# Patient Record
Sex: Female | Born: 1950 | Race: White | Hispanic: Yes | Marital: Married | Smoking: Never smoker
Health system: Southern US, Community
[De-identification: ages and names within clinical notes are randomized; demographics above are authoritative.]

## PROBLEM LIST (undated history)

## (undated) DIAGNOSIS — C801 Malignant (primary) neoplasm, unspecified: Secondary | ICD-10-CM

## (undated) DIAGNOSIS — I10 Essential (primary) hypertension: Secondary | ICD-10-CM

## (undated) HISTORY — PX: GALLBLADDER SURGERY: SHX652

## (undated) HISTORY — PX: TONSILECTOMY, ADENOIDECTOMY, BILATERAL MYRINGOTOMY AND TUBES: SHX2538

---

## 2021-09-12 ENCOUNTER — Other Ambulatory Visit: Payer: Self-pay

## 2021-09-12 ENCOUNTER — Ambulatory Visit
Admission: RE | Admit: 2021-09-12 | Discharge: 2021-09-12 | Disposition: A | Discharge status: Home / Self Care | Payer: Medicare (Managed Care) | Source: Ambulatory Visit | Attending: Physician Assistant | Admitting: Physician Assistant

## 2021-09-12 VITALS — BP 137/63 | HR 90 | Temp 98.1°F | Resp 19 | Ht 65.0 in | Wt 205.0 lb

## 2021-09-12 DIAGNOSIS — R0981 Nasal congestion: Secondary | ICD-10-CM | POA: Insufficient documentation

## 2021-09-12 DIAGNOSIS — J069 Acute upper respiratory infection, unspecified: Secondary | ICD-10-CM | POA: Insufficient documentation

## 2021-09-12 DIAGNOSIS — Z20822 Contact with and (suspected) exposure to covid-19: Secondary | ICD-10-CM | POA: Insufficient documentation

## 2021-09-12 DIAGNOSIS — R051 Acute cough: Secondary | ICD-10-CM | POA: Diagnosis present

## 2021-09-12 MED ORDER — PROMETHAZINE-DM 6.25-15 MG/5ML PO SYRP: 5.0000 mL | ORAL_SOLUTION | Freq: Four times a day (QID) | ORAL | 0 refills | Status: DC | PRN

## 2021-09-12 NOTE — ED Provider Notes (Signed)
[facility]    CSN: [id number] Arrival date & time: 09/12/21  1253      History   Chief Complaint Chief Complaint  Patient presents with   Cough    HPI Leah Henry is a 70 y.o. female presenting for 5-day history of cough that is productive of whitish colored sputum.  She also admits to a lot of nasal congestion with thicker nasal drainage that has been previously.  Denies fever.  Admits to some fatigue.  No sinus pain, chest pain or breathing difficulty.  She does state that whenever she coughs she has pain of the left side of her back.  She is concerned for possible pneumonia since she has had pneumonia in the past.  Her mother is ill with similar symptoms.  Patient denies any known COVID exposure.  She has been fully vaccinated for COVID-19.  She has not been taking any over-the-counter cough medication because she says she wanted to be evaluated first.  No history of asthma or breathing issues.  History of hypertension.  No other complaints.  HPI  History reviewed. No pertinent past medical history.  There are no problems to display for this patient.   History reviewed. No pertinent surgical history.  OB History   No obstetric history on file.      Home Medications    Prior to Admission medications   Medication Sig Start Date End Date Taking? Authorizing Provider  carbamazepine (TEGRETOL) 200 MG tablet Take 1 tablet by mouth 3 (three) times daily. 12/15/09  Yes [provider]  metoprolol tartrate (LOPRESSOR) 25 MG tablet TAKE 1 TABLET BY MOUTH WITH FOOD TWICE A DAY FOR 30 DAYS 10/29/16  Yes [provider]  promethazine-dextromethorphan (PROMETHAZINE-DM) 6.25-15 MG/5ML syrup Take 5 mLs by mouth 4 (four) times daily as needed for cough. 09/12/21  Yes Laurene Footman B, PA-C  lisinopril (ZESTRIL) 40 MG tablet Take by mouth.    [provider]    Family History History reviewed. No pertinent family history.  Social  History Social History   Tobacco Use   Smoking status: Never   Smokeless tobacco: Never  Vaping Use   Vaping Use: Never used  Substance Use Topics   Alcohol use: Never     Allergies   Aspirin and Sulfa antibiotics   Review of Systems Review of Systems  Constitutional:  Positive for fatigue. Negative for chills, diaphoresis and fever.  HENT:  Positive for congestion and rhinorrhea. Negative for ear pain, sinus pressure, sinus pain and sore throat.   Respiratory:  Positive for cough. Negative for shortness of breath.   Gastrointestinal:  Negative for abdominal pain, nausea and vomiting.  Musculoskeletal:  Negative for arthralgias and myalgias.  Skin:  Negative for rash.  Neurological:  Negative for weakness and headaches.  Hematological:  Negative for adenopathy.    Physical Exam Triage Vital Signs ED Triage Vitals  Enc Vitals Group     BP 09/12/21 1342 137/63     Pulse Rate 09/12/21 1342 90     Resp 09/12/21 1342 19     Temp 09/12/21 1342 98.1 F (36.7 C)     Temp Source 09/12/21 1342 Oral     SpO2 09/12/21 1342 98 %     Weight 09/12/21 1343 205 lb (93 kg)     Height 09/12/21 1343 5\' 5"  (1.651 m)     Head Circumference --      Peak Flow --      Pain Score 09/12/21 1340  0     Pain Loc --      Pain Edu? --      Excl. in [place]? --    No data found.  Updated Vital Signs BP 137/63 (BP Location: Right Arm)   Pulse 90   Temp 98.1 F (36.7 C) (Oral)   Resp 19   Ht 5\' 5"  (1.651 m)   Wt 205 lb (93 kg)   SpO2 98%   BMI 34.11 kg/m      Physical Exam Vitals and nursing note reviewed.  Constitutional:      General: She is not in acute distress.    Appearance: Normal appearance. She is not ill-appearing or toxic-appearing.  HENT:     Head: Normocephalic and atraumatic.     Nose: Congestion present.     Mouth/Throat:     Mouth: Mucous membranes are moist.     Pharynx: Oropharynx is clear.  Eyes:     General: No scleral icterus.       Right eye: No discharge.         Left eye: No discharge.     Conjunctiva/sclera: Conjunctivae normal.  Cardiovascular:     Rate and Rhythm: Normal rate and regular rhythm.     Heart sounds: Normal heart sounds.  Pulmonary:     Effort: Pulmonary effort is normal. No respiratory distress.     Breath sounds: Normal breath sounds. No wheezing, rhonchi or rales.  Musculoskeletal:     Cervical back: Neck supple.  Skin:    General: Skin is dry.  Neurological:     General: No focal deficit present.     Mental Status: She is alert. Mental status is at baseline.     Motor: No weakness.     Gait: Gait normal.  Psychiatric:        Mood and Affect: Mood normal.        Behavior: Behavior normal.        Thought Content: Thought content normal.     UC Treatments / Results  Labs (all labs ordered are listed, but only abnormal results are displayed) Labs Reviewed  SARS CORONAVIRUS 2 (TAT 6-24 HRS)    EKG   Radiology No results found.  Procedures Procedures (including critical care time)  Medications Ordered in UC Medications - No data to display  Initial Impression / Assessment and Plan / UC Course  I have reviewed the triage vital signs and the nursing notes.  Pertinent labs & imaging results that were available during my care of the patient were reviewed by me and considered in my medical decision making (see chart for details).  70 year old female presenting for cough and congestion for the past 5 days.  Mother is ill with similar symptoms.  No known COVID exposure.  Vitals all normal and stable and she is overall well-appearing.  Exam significant for nasal congestion.  Chest is clear to auscultation heart regular rate and rhythm.  Advised patient of my low concern for pneumonia at this time given her normal vital signs and clear chest.  Advised close monitoring and if she develops a fever, worsening cough, chest pain or breathing difficulty she should be seen again and we can consider chest x-ray at that  time but not warranted currently.  Pain in her back is likely due to all of the coughing.  She has not been taking any cough suppressants.  PCR COVID test obtained.  Current CDC guidelines, isolation protocol and ED precautions reviewed if COVID-positive.  I have sent Promethazine DM for cough and encouraged her to increase rest and fluids.  Follow-up as needed.  Final Clinical Impressions(s) / UC Diagnoses   Final diagnoses:  Viral upper respiratory tract infection  Acute cough  Nasal congestion     Discharge Instructions      URI/COLD SYMPTOMS: Your exam today is consistent with a viral illness. Antibiotics are not indicated at this time. Use medications as directed, including cough syrup, nasal saline, and decongestants. Your symptoms should improve over the next few days and resolve within 7-10 days. Increase rest and fluids. F/u if symptoms worsen or predominate such as sore throat, ear pain, productive cough, shortness of breath, or if you develop high fevers or worsening fatigue over the next several days.    You have received COVID testing today either for positive exposure, concerning symptoms that could be related to COVID infection, screening purposes, or re-testing after confirmed positive.  Your test obtained today checks for active viral infection in the last 1-2 weeks. If your test is negative now, you can still test positive later. So, if you do develop symptoms you should either get re-tested and/or isolate x 5 days and then strict mask use x 5 days (unvaccinated) or mask use x 10 days (vaccinated). Please follow CDC guidelines.  While Rapid antigen tests come back in 15-20 minutes, send out PCR/molecular test results typically come back within 1-3 days. In the mean time, if you are symptomatic, assume this could be a positive test and treat/monitor yourself as if you do have COVID.   We will call with test results if positive. Please download the MyChart app and set up a  profile to access test results.   If symptomatic, go home and rest. Push fluids. Take Tylenol as needed for discomfort. Gargle warm salt water. Throat lozenges. Take Mucinex DM or Robitussin for cough. Humidifier in bedroom to ease coughing. Warm showers. Also review the COVID handout for more information.  COVID-19 INFECTION: The incubation period of COVID-19 is approximately 14 days after exposure, with most symptoms developing in roughly 4-5 days. Symptoms may range in severity from mild to critically severe. Roughly 80% of those infected will have mild symptoms. People of any age may become infected with COVID-19 and have the ability to transmit the virus. The most common symptoms include: fever, fatigue, cough, body aches, headaches, sore throat, nasal congestion, shortness of breath, nausea, vomiting, diarrhea, changes in smell and/or taste.    COURSE OF ILLNESS Some patients may begin with mild disease which can progress quickly into critical symptoms. If your symptoms are worsening please call ahead to the Emergency Department and proceed there for further treatment. Recovery time appears to be roughly 1-2 weeks for mild symptoms and 3-6 weeks for severe disease.   GO IMMEDIATELY TO ER FOR FEVER YOU ARE UNABLE TO GET DOWN WITH TYLENOL, BREATHING PROBLEMS, CHEST PAIN, FATIGUE, LETHARGY, INABILITY TO EAT OR DRINK, ETC  QUARANTINE AND ISOLATION: To help decrease the spread of COVID-19 please remain isolated if you have COVID infection or are highly suspected to have COVID infection. This means -stay home and isolate to one room in the home if you live with others. Do not share a bed or bathroom with others while ill, sanitize and wipe down all countertops and keep common areas clean and disinfected. Stay home for 5 days. If you have no symptoms or your symptoms are resolving after 5 days, you can leave your house. Continue to wear a  mask around others for 5 additional days. If you have been in close  contact (within 6 feet) of someone diagnosed with COVID 19, you are advised to quarantine in your home for 14 days as symptoms can develop anywhere from 2-14 days after exposure to the virus. If you develop symptoms, you  must isolate.  Most current guidelines for COVID after exposure -unvaccinated: isolate 5 days and strict mask use x 5 days. Test on day 5 is possible -vaccinated: wear mask x 10 days if symptoms do not develop -You do not necessarily need to be tested for COVID if you have + exposure and  develop symptoms. Just isolate at home x10 days from symptom onset During this global pandemic, CDC advises to practice social distancing, try to stay at least 16ft away from others at all times. Wear a face covering. Wash and sanitize your hands regularly and avoid going anywhere that is not necessary.  KEEP IN MIND THAT THE COVID TEST IS NOT 100% ACCURATE AND YOU SHOULD STILL DO EVERYTHING TO PREVENT POTENTIAL SPREAD OF VIRUS TO OTHERS (WEAR MASK, WEAR GLOVES, [place] HANDS AND SANITIZE REGULARLY). IF INITIAL TEST IS NEGATIVE, THIS MAY NOT MEAN YOU ARE DEFINITELY NEGATIVE. MOST ACCURATE TESTING IS DONE 5-7 DAYS AFTER EXPOSURE.   It is not advised by CDC to get re-tested after receiving a positive COVID test since you can still test positive for weeks to months after you have already cleared the virus.   *If you have not been vaccinated for COVID, I strongly suggest you consider getting vaccinated as long as there are no contraindications.       ED Prescriptions     Medication Sig Dispense Auth. Provider   promethazine-dextromethorphan (PROMETHAZINE-DM) 6.25-15 MG/5ML syrup Take 5 mLs by mouth 4 (four) times daily as needed for cough. 118 mL Danton Clap, PA-C      PDMP not reviewed this encounter.   Danton Clap, PA-C 09/12/21 1516

## 2021-09-12 NOTE — Discharge Instructions (Signed)

## 2021-09-12 NOTE — ED Triage Notes (Addendum)
Pt reports 5 days of cough and productive of light colored sputum. Hurts in her back when coughing. Pt states she has had pneumonia 4 times before

## 2021-09-13 ENCOUNTER — Ambulatory Visit
Admission: EM | Admit: 2021-09-13 | Discharge: 2021-09-13 | Disposition: A | Discharge status: Home / Self Care | Payer: Medicare (Managed Care) | Attending: Medical Oncology | Admitting: Medical Oncology

## 2021-09-13 ENCOUNTER — Other Ambulatory Visit: Payer: Self-pay

## 2021-09-13 ENCOUNTER — Ambulatory Visit (INDEPENDENT_AMBULATORY_CARE_PROVIDER_SITE_OTHER): Payer: Medicare (Managed Care)

## 2021-09-13 DIAGNOSIS — R051 Acute cough: Secondary | ICD-10-CM | POA: Diagnosis not present

## 2021-09-13 DIAGNOSIS — Z8701 Personal history of pneumonia (recurrent): Secondary | ICD-10-CM | POA: Insufficient documentation

## 2021-09-13 DIAGNOSIS — R059 Cough, unspecified: Secondary | ICD-10-CM

## 2021-09-13 DIAGNOSIS — Z8709 Personal history of other diseases of the respiratory system: Secondary | ICD-10-CM | POA: Insufficient documentation

## 2021-09-13 DIAGNOSIS — R509 Fever, unspecified: Secondary | ICD-10-CM

## 2021-09-13 HISTORY — DX: Malignant (primary) neoplasm, unspecified: C80.1

## 2021-09-13 LAB — INFLUENZA A AND B ANTIGEN (CONVERTED LAB)
INFLUENZA A ANTIGEN, POC: NEGATIVE
INFLUENZA B ANTIGEN, POC: NEGATIVE

## 2021-09-13 LAB — SARS CORONAVIRUS 2 (TAT 6-24 HRS): SARS Coronavirus 2: NEGATIVE

## 2021-09-13 MED ORDER — METHYLPREDNISOLONE SODIUM SUCC 125 MG IJ SOLR
125.0000 mg | Freq: Once | INTRAMUSCULAR | Status: AC
  Administered 2021-09-13: 125 mg via INTRAMUSCULAR

## 2021-09-13 MED ORDER — ALBUTEROL SULFATE HFA 108 (90 BASE) MCG/ACT IN AERS: 1.0000 | INHALATION_SPRAY | Freq: Four times a day (QID) | RESPIRATORY_TRACT | 0 refills | Status: DC | PRN

## 2021-09-13 MED ORDER — BENZONATATE 100 MG PO CAPS: 100.0000 mg | ORAL_CAPSULE | Freq: Three times a day (TID) | ORAL | 0 refills | Status: DC

## 2021-09-13 MED ORDER — FLUTICASONE PROPIONATE 50 MCG/ACT NA SUSP: 2.0000 | Freq: Every day | NASAL | 0 refills | Status: DC

## 2021-09-13 NOTE — ED Provider Notes (Addendum)
[facility]    CSN: [id number] Arrival date & time: 09/13/21  1055      History   Chief Complaint Chief Complaint  Patient presents with   Cough    HPI Leah Henry is a 70 y.o. female.   HPI  Cough: From her visit yesterday: "Leah Henry is a 70 y.o. female presenting for 5-day history of cough that is productive of whitish colored sputum.  She also admits to a lot of nasal congestion with thicker nasal drainage that has been previously.  Denies fever.  Admits to some fatigue.  No sinus pain, chest pain or breathing difficulty.  She does state that whenever she coughs she has pain of the left side of her back.  She is concerned for possible pneumonia since she has had pneumonia in the past.  Her mother is ill with similar symptoms.  Patient denies any known COVID exposure.  She has been fully vaccinated for COVID-19.  She has not been taking any over-the-counter cough medication because she says she wanted to be evaluated first.  No history of asthma or breathing issues.  History of hypertension.  No other complaints."   Today she states that cough has worsened in terms of how frequently she is coughing. She is also now blowing out "large amounts" of yellow nasal discharge from her nose. She is concerned that given these changes she has pneumonia. Of note, she has not had any fevers, SOB, chest pain. Her COVID-19 test yesterday was negative.   Past Medical History:  Diagnosis Date   Cancer ([place])     There are no problems to display for this patient.   History reviewed. No pertinent surgical history.  OB History   No obstetric history on file.      Home Medications    Prior to Admission medications   Medication Sig Start Date End Date Taking? Authorizing Provider  carbamazepine (TEGRETOL) 200 MG tablet Take 1 tablet by mouth 3 (three) times daily. 12/15/09   [provider]  lisinopril (ZESTRIL) 40 MG tablet Take by mouth.    [provider]  metoprolol tartrate (LOPRESSOR) 25 MG tablet TAKE 1 TABLET BY MOUTH WITH FOOD TWICE A DAY FOR 30 DAYS 10/29/16   [provider]  promethazine-dextromethorphan (PROMETHAZINE-DM) 6.25-15 MG/5ML syrup Take 5 mLs by mouth 4 (four) times daily as needed for cough. 09/12/21   Danton Clap, PA-C    Family History History reviewed. No pertinent family history.  Social History Social History   Tobacco Use   Smoking status: Never   Smokeless tobacco: Never  Vaping Use   Vaping Use: Never used  Substance Use Topics   Alcohol use: Never     Allergies   Aspirin and Sulfa antibiotics   Review of Systems Review of Systems  As stated above in HPI Physical Exam Triage Vital Signs ED Triage Vitals  Enc Vitals Group     BP 09/13/21 1109 130/78     Pulse Rate 09/13/21 1109 93     Resp 09/13/21 1109 20     Temp 09/13/21 1109 98.2 F (36.8 C)     Temp Source 09/13/21 1109 Oral     SpO2 09/13/21 1109 98 %     Weight 09/13/21 1108 204 lb 12.9 oz (92.9 kg)     Height 09/13/21 1108 5\' 5"  (1.651 m)     Head Circumference --      Peak Flow --      Pain  Score 09/13/21 1108 0     Pain Loc --      Pain Edu? --      Excl. in [place]? --    No data found.  Updated Vital Signs BP 130/78 (BP Location: Right Arm)   Pulse 93   Temp 98.2 F (36.8 C) (Oral)   Resp 20   Ht 5\' 5"  (1.651 m)   Wt 204 lb 12.9 oz (92.9 kg)   SpO2 98%   BMI 34.08 kg/m   Physical Exam Vitals and nursing note reviewed.  Constitutional:      General: She is not in acute distress.    Appearance: Normal appearance. She is not ill-appearing, toxic-appearing or diaphoretic.     Comments: Wheeze like cough  HENT:     Head: Normocephalic and atraumatic.     Right Ear: Tympanic membrane normal.     Left Ear: Tympanic membrane normal.     Nose: Congestion and rhinorrhea (mild clear/yellow) present.  Eyes:     Extraocular Movements: Extraocular movements intact.     Conjunctiva/sclera:  Conjunctivae normal.     Pupils: Pupils are equal, round, and reactive to light.  Cardiovascular:     Rate and Rhythm: Normal rate and regular rhythm.     Heart sounds: Normal heart sounds.  Pulmonary:     Effort: Pulmonary effort is normal. No respiratory distress.     Breath sounds: Wheezing (scant throughout) present. No rhonchi or rales.  Musculoskeletal:     Cervical back: Normal range of motion and neck supple.  Lymphadenopathy:     Cervical: No cervical adenopathy.  Skin:    General: Skin is warm.  Neurological:     Mental Status: She is alert and oriented to person, place, and time.     UC Treatments / Results  Labs (all labs ordered are listed, but only abnormal results are displayed) Labs Reviewed - No data to display  EKG   Radiology DG Chest 2 View  Result Date: 09/13/2021 CLINICAL DATA:  Cough and fever EXAM: CHEST - 2 VIEW COMPARISON:  None. FINDINGS: Normal heart size and mediastinal contours. No acute infiltrate or edema. No effusion or pneumothorax. No acute osseous findings. Degenerative endplate spurring IMPRESSION: Negative chest Electronically Signed   By: Jorje Guild M.D.   On: 09/13/2021 11:23    Procedures Procedures (including critical care time)  Medications Ordered in UC Medications - No data to display  Initial Impression / Assessment and Plan / UC Course  I have reviewed the triage vital signs and the nursing notes.  Pertinent labs & imaging results that were available during my care of the patient were reviewed by me and considered in my medical decision making (see chart for details).     New.  Comparing the examinations from yesterday and today she does have some wheezing.  Given her history and this fact we are going to obtain a chest x-ray to ensure no sign of pneumonia as she reports that she has had pneumonia 4 times.  In addition I am going to administer Solu-Medrol(she reports that she has tolerated well in the past) and have her  start Tessalon and Flonase to help with her symptoms and ensure an influenza test is negative.  Discussed red flag signs and symptoms.  Follow-up. Final Clinical Impressions(s) / UC Diagnoses   Final diagnoses:  None   Discharge Instructions   None    ED Prescriptions   None    PDMP not reviewed this  encounter.   Hughie Closs, PA-C 09/13/21 [place], PA-C 09/13/21 1206

## 2021-09-13 NOTE — ED Triage Notes (Signed)
Pt seen here yesterday for cough. Cough continues to worsen. Blowing large amounts of pale yellow secretions from nose

## 2021-09-14 LAB — POC SARS CORONAVIRUS 2 AG: SARSCOV2ONAVIRUS 2 AG: NEGATIVE

## 2021-09-18 ENCOUNTER — Other Ambulatory Visit: Payer: Self-pay

## 2021-09-18 ENCOUNTER — Encounter: Payer: Self-pay | Admitting: Emergency Medicine

## 2021-09-18 ENCOUNTER — Ambulatory Visit
Admission: EM | Admit: 2021-09-18 | Discharge: 2021-09-18 | Disposition: A | Discharge status: Home / Self Care | Payer: Medicare (Managed Care) | Attending: Medical Oncology | Admitting: Medical Oncology

## 2021-09-18 DIAGNOSIS — J209 Acute bronchitis, unspecified: Secondary | ICD-10-CM | POA: Diagnosis not present

## 2021-09-18 DIAGNOSIS — Z8701 Personal history of pneumonia (recurrent): Secondary | ICD-10-CM

## 2021-09-18 MED ORDER — PREDNISONE 10 MG PO TABS: ORAL_TABLET | ORAL | 0 refills | Status: DC

## 2021-09-18 MED ORDER — DOXYCYCLINE HYCLATE 100 MG PO CAPS: 100.0000 mg | ORAL_CAPSULE | Freq: Two times a day (BID) | ORAL | 0 refills | Status: DC

## 2021-09-18 NOTE — ED Provider Notes (Addendum)
[facility]    CSN: [id number] Arrival date & time: 09/18/21  1018      History   Chief Complaint Chief Complaint  Patient presents with   Cough   Emesis    HPI Leah Henry is a 70 y.o. female.   HPI  Cough: Patient has had a cough for about 1 week.  She has been seen in office on 1015 and 1016 for symptoms of cough and chest congestion.  She has had a negative chest x-ray, as well as negative COVID and influenza testing was suspected to have a viral illness.  She has been treated with Tessalon, Flonase and Rx cough syrup.  We also administered solu-medrol at her last visit which did help her greatly for 3 days per pt. She states that she has tolerated this and prednisone well in the past. Today she states that the cough has continued and now she is having vomiting episodes from the cough along with thick colored mucous. She denies SOB, chest pain, recent fevers. No hemoptysis, fever but patient reports that she is very concerned about pneumonia given her history of "walking pneumonia".   Past Medical History:  Diagnosis Date   Cancer ([place])     There are no problems to display for this patient.   History reviewed. No pertinent surgical history.  OB History   No obstetric history on file.      Home Medications    Prior to Admission medications   Medication Sig Start Date End Date Taking? Authorizing Provider  albuterol (VENTOLIN HFA) 108 (90 Base) MCG/ACT inhaler Inhale 1-2 puffs into the lungs every 6 (six) hours as needed for wheezing or shortness of breath. 09/13/21   Hughie Closs, PA-C  benzonatate (TESSALON) 100 MG capsule Take 1 capsule (100 mg total) by mouth every 8 (eight) hours. 09/13/21   Hughie Closs, PA-C  carbamazepine (TEGRETOL) 200 MG tablet Take 1 tablet by mouth 3 (three) times daily. 12/15/09   [provider]  fluticasone (FLONASE) 50 MCG/ACT nasal spray Place 2 sprays into both nostrils daily. 09/13/21    Hughie Closs, PA-C  lisinopril (ZESTRIL) 40 MG tablet Take by mouth.    [provider]  metoprolol tartrate (LOPRESSOR) 25 MG tablet TAKE 1 TABLET BY MOUTH WITH FOOD TWICE A DAY FOR 30 DAYS 10/29/16   [provider]  promethazine-dextromethorphan (PROMETHAZINE-DM) 6.25-15 MG/5ML syrup Take 5 mLs by mouth 4 (four) times daily as needed for cough. 09/12/21   Danton Clap, PA-C    Family History History reviewed. No pertinent family history.  Social History Social History   Tobacco Use   Smoking status: Never   Smokeless tobacco: Never  Vaping Use   Vaping Use: Never used  Substance Use Topics   Alcohol use: Never     Allergies   Aspirin and Sulfa antibiotics   Review of Systems Review of Systems  As stated above in HPI Physical Exam Triage Vital Signs ED Triage Vitals  Enc Vitals Group     BP 09/18/21 1033 140/78     Pulse Rate 09/18/21 1033 78     Resp 09/18/21 1033 14     Temp 09/18/21 1033 98.1 F (36.7 C)     Temp Source 09/18/21 1033 Oral     SpO2 09/18/21 1033 98 %     Weight 09/18/21 1031 204 lb 12.9 oz (92.9 kg)     Height 09/18/21 1031 5\' 5"  (1.651 m)  Head Circumference --      Peak Flow --      Pain Score 09/18/21 1031 0     Pain Loc --      Pain Edu? --      Excl. in [place]? --    No data found.  Updated Vital Signs BP 140/78 (BP Location: Right Arm)   Pulse 78   Temp 98.1 F (36.7 C) (Oral)   Resp 14   Ht 5\' 5"  (1.651 m)   Wt 204 lb 12.9 oz (92.9 kg)   SpO2 98%   BMI 34.08 kg/m   Physical Exam Vitals and nursing note reviewed.  Constitutional:      General: She is not in acute distress.    Appearance: Normal appearance. She is not ill-appearing, toxic-appearing or diaphoretic.  HENT:     Head: Normocephalic and atraumatic.     Right Ear: Tympanic membrane normal.     Left Ear: Tympanic membrane normal.     Nose: Congestion and rhinorrhea present.     Mouth/Throat:     Mouth: Mucous membranes are moist.      Pharynx: Oropharynx is clear. No oropharyngeal exudate or posterior oropharyngeal erythema.  Eyes:     Extraocular Movements: Extraocular movements intact.     Conjunctiva/sclera: Conjunctivae normal.     Pupils: Pupils are equal, round, and reactive to light.  Cardiovascular:     Rate and Rhythm: Normal rate and regular rhythm.     Heart sounds: Normal heart sounds.  Pulmonary:     Breath sounds: Wheezing (throughout) present.  Musculoskeletal:     Cervical back: Normal range of motion and neck supple.  Lymphadenopathy:     Cervical: No cervical adenopathy.  Skin:    General: Skin is warm.     Comments: No cyanosis  Neurological:     Mental Status: She is alert and oriented to person, place, and time.     UC Treatments / Results  Labs (all labs ordered are listed, but only abnormal results are displayed) Labs Reviewed - No data to display  EKG   Radiology No results found.  Procedures Procedures (including critical care time)  Medications Ordered in UC Medications - No data to display  Initial Impression / Assessment and Plan / UC Course  I have reviewed the triage vital signs and the nursing notes.  Pertinent labs & imaging results that were available during my care of the patient were reviewed by me and considered in my medical decision making (see chart for details).     New. We have elected to start her on low dose prednisone to avoid medication interaction along with doxycyline. Discussed how to use along with common potential side effects and precautions. Discussed red flag signs and symptoms. Follow up PRN.  Final Clinical Impressions(s) / UC Diagnoses   Final diagnoses:  None   Discharge Instructions   None    ED Prescriptions   None    PDMP not reviewed this encounter.   Hughie Closs, PA-C 09/18/21 1056    [address] [place] 09/18/21 1101

## 2021-09-18 NOTE — ED Triage Notes (Signed)
Patient states that she was seen here on 10/15/ 10/16 for cough and chest congestion.  Patient reports cough for a week.  Patient denies fevers.  Patient reports vomiting due to the congestion in her chest.

## 2022-05-03 ENCOUNTER — Ambulatory Visit
Admission: EM | Admit: 2022-05-03 | Discharge: 2022-05-03 | Disposition: A | Discharge status: Home / Self Care | Payer: Medicare (Managed Care) | Attending: Physician Assistant | Admitting: Physician Assistant

## 2022-05-03 DIAGNOSIS — Z8719 Personal history of other diseases of the digestive system: Secondary | ICD-10-CM | POA: Insufficient documentation

## 2022-05-03 DIAGNOSIS — R300 Dysuria: Secondary | ICD-10-CM | POA: Diagnosis not present

## 2022-05-03 DIAGNOSIS — J029 Acute pharyngitis, unspecified: Secondary | ICD-10-CM | POA: Insufficient documentation

## 2022-05-03 LAB — URINALYSIS, ROUTINE W REFLEX MICROSCOPIC
Bilirubin Urine: NEGATIVE
Glucose, UA: NEGATIVE mg/dL
Hgb urine dipstick: NEGATIVE
Ketones, ur: NEGATIVE mg/dL
Leukocytes,Ua: NEGATIVE
Nitrite: NEGATIVE
Protein, ur: NEGATIVE mg/dL
Specific Gravity, Urine: 1.015 (ref 1.005–1.030)
pH: 5.5 (ref 5.0–8.0)

## 2022-05-03 LAB — WET PREP, GENITAL
Clue Cells Wet Prep HPF POC: NONE SEEN
Sperm: NONE SEEN
Trich, Wet Prep: NONE SEEN
WBC, Wet Prep HPF POC: <10 — AB (ref <10)
Yeast Wet Prep HPF POC: NONE SEEN

## 2022-05-03 LAB — GROUP A STREP BY PCR: Group A Strep by PCR: NOT DETECTED

## 2022-05-03 NOTE — ED Provider Notes (Signed)
Is [facility]    CSN: [id number] Arrival date & time: 05/03/22  1045      History   Chief Complaint Chief Complaint  Patient presents with   Sore Throat   Urinary Tract Infection    HPI Leah Henry is a 71 y.o. female presenting for multiple complaints.  First patient states she has had scratchy and burning throat for the past 4 days.  She denies any associated fever, cough, congestion, sinus pain or pressure.  History of GERD.  Says she does not take any medication for that.  Reports exposure to a sick contacts who also had a sore throat and says she would like a strep test.  Patient also presenting for dysuria for the past 1 week.  Denies associated urinary frequency, urgency, hematuria, abdominal/pelvic pain.  Reports concern for UTI.  Reports she has an upcoming appointment for evaluation for this but wanted to be seen sooner as it is in several days.  Has history of atrophic vaginitis.  Medical history significant for remission of non-Hodgkin's lymphoma as of March 2023.  Other medical history significant for epilepsy and stable/treated mood disorder.  HPI  Past Medical History:  Diagnosis Date   Cancer ([place])     There are no problems to display for this patient.   History reviewed. No pertinent surgical history.  OB History   No obstetric history on file.      Home Medications    Prior to Admission medications   Medication Sig Start Date End Date Taking? Authorizing Provider  carbamazepine (TEGRETOL) 200 MG tablet Take 1 tablet by mouth 3 (three) times daily. 12/15/09  Yes [provider]  lisinopril (ZESTRIL) 40 MG tablet Take by mouth.   Yes [provider]  metoprolol tartrate (LOPRESSOR) 25 MG tablet TAKE 1 TABLET BY MOUTH WITH FOOD TWICE A DAY FOR 30 DAYS 10/29/16  Yes [provider]    Family History History reviewed. No pertinent family history.  Social History Social History   Tobacco Use   Smoking status:  Never   Smokeless tobacco: Never  Vaping Use   Vaping Use: Never used  Substance Use Topics   Alcohol use: Never   Drug use: Never     Allergies   Aspirin and Sulfa antibiotics   Review of Systems Review of Systems  Constitutional:  Negative for chills, diaphoresis, fatigue and fever.  HENT:  Positive for sore throat. Negative for congestion, ear pain and rhinorrhea.   Respiratory:  Negative for cough and shortness of breath.   Gastrointestinal:  Negative for abdominal pain, nausea and vomiting.  Genitourinary:  Positive for dysuria. Negative for difficulty urinating, frequency, hematuria and urgency.  Musculoskeletal:  Negative for myalgias.  Skin:  Negative for rash.  Neurological:  Negative for weakness and headaches.    Physical Exam Triage Vital Signs ED Triage Vitals  Enc Vitals Group     BP 05/03/22 1111 140/79     Pulse Rate 05/03/22 1111 78     Resp 05/03/22 1111 12     Temp 05/03/22 1111 98.6 F (37 C)     Temp Source 05/03/22 1111 Oral     SpO2 05/03/22 1111 100 %     Weight 05/03/22 1110 203 lb (92.1 kg)     Height 05/03/22 1110 '5\' 5"'$  (1.651 m)     Head Circumference --      Peak Flow --      Pain Score 05/03/22 1109 0  Pain Loc --      Pain Edu? --      Excl. in [place]? --    No data found.  Updated Vital Signs BP 140/79 (BP Location: Left Arm)   Pulse 78   Temp 98.6 F (37 C) (Oral)   Resp 12   Ht '5\' 5"'$  (1.651 m)   Wt 203 lb (92.1 kg)   SpO2 100%   BMI 33.78 kg/m          Physical Exam Vitals and nursing note reviewed.  Constitutional:      General: She is not in acute distress.    Appearance: Normal appearance. She is not ill-appearing or toxic-appearing.  HENT:     Head: Normocephalic and atraumatic.     Nose: Nose normal.     Mouth/Throat:     Mouth: Mucous membranes are moist.     Pharynx: Oropharynx is clear. Posterior oropharyngeal erythema (mild) present.  Eyes:     General: No scleral icterus.       Right eye: No  discharge.        Left eye: No discharge.     Conjunctiva/sclera: Conjunctivae normal.  Cardiovascular:     Rate and Rhythm: Normal rate and regular rhythm.     Heart sounds: Normal heart sounds.  Pulmonary:     Effort: Pulmonary effort is normal. No respiratory distress.     Breath sounds: Normal breath sounds.  Genitourinary:    Vagina: No vaginal discharge.     Comments: Slight vulvar dryness Musculoskeletal:     Cervical back: Neck supple.  Skin:    General: Skin is dry.  Neurological:     General: No focal deficit present.     Mental Status: She is alert. Mental status is at baseline.     Motor: No weakness.     Gait: Gait normal.  Psychiatric:        Mood and Affect: Mood normal.        Behavior: Behavior normal.        Thought Content: Thought content normal.     UC Treatments / Results  Labs (all labs ordered are listed, but only abnormal results are displayed) Labs Reviewed  WET PREP, GENITAL - Abnormal; Notable for the following components:      Result Value   WBC, Wet Prep HPF POC <10 (*)    All other components within normal limits  GROUP A STREP BY PCR  URINE CULTURE  URINALYSIS, ROUTINE W REFLEX MICROSCOPIC    EKG   Radiology No results found.  Procedures Procedures (including critical care time)  Medications Ordered in UC Medications - No data to display  Initial Impression / Assessment and Plan / UC Course  I have reviewed the triage vital signs and the nursing notes.  Pertinent labs & imaging results that were available during my care of the patient were reviewed by me and considered in my medical decision making (see chart for details).  71 year old female presenting for multiple complaints including scratchy throat with burning for the past 4 days and no other associated URI symptoms as well as approximately 1 week history of urinary burning.  Vitals normal and stable patient overall well-appearing.  On exam she has mild erythema posterior  pharynx.  Chest clear to auscultation heart regular rate and rhythm.  No abdominal tenderness.  GU exam significant for slight vulvar dryness, otherwise normal.  PCR strep test negative.  Urinalysis normal.  Will send for culture.  Wet  prep negative.  Discussed all results with patient.  Advised her that her sore throat may likely be related to her acid reflux and I offered acid reflux medication but she declines.  Will take OTC Tums if needed.  Patient has appointment with her doctor in 1 week for her vaginal symptoms and burning.  Advised her to keep the appointment.  She may need treatment for atrophic vaginitis.  States she is not sure she wants treatment for that.  History of cancer in remission and she does not want to take any medication if not needed.  Advised to keep the appointment as she may need further work-up for her symptoms.  We will call her if the culture is positive and send antibiotics though.   Final Clinical Impressions(s) / UC Diagnoses   Final diagnoses:  Sore throat  Dysuria  History of gastroesophageal reflux (GERD)     Discharge Instructions      -Your strep is negative.  Your sore throat may be related to acid reflux.  I would suggest over-the-counter Prilosec, plenty of rest and fluids.  Avoid spicy or greasy foods, caffeine or alcohol.  Eat smaller meals and not close to bedtime. - Your urinalysis is normal and the vaginal swab was also normal.  No evidence of UTI or yeast infection or bacterial vaginal infection.  I will send the urine sample to the lab to look further for bacteria but does not appear to be consistent with a UTI at this time.  We will call you if your result is positive and send antibiotics for you.  Increase your fluid intake at this time.  The burning could be related to atrophic vaginitis.  Make a follow-up appointment with your PCP or OB/GYN.  There are different creams that can be prescribed to help with this condition.     ED  Prescriptions   None    PDMP not reviewed this encounter.   Danton Clap, PA-C 05/03/22 1226

## 2022-05-03 NOTE — ED Triage Notes (Signed)
Pt states that she is in cancer remission.   Pt states that she is having "scratching and burning" in her throat x4days and urinary burning x1weeks.   Pt was around her niece who was sick. Pt states that she did not go to the doctor to be evaluated.   Pt asks to be tested for strep throat

## 2022-05-03 NOTE — Discharge Instructions (Signed)
-  Your strep is negative.  Your sore throat may be related to acid reflux.  I would suggest over-the-counter Prilosec, plenty of rest and fluids.  Avoid spicy or greasy foods, caffeine or alcohol.  Eat smaller meals and not close to bedtime. - Your urinalysis is normal and the vaginal swab was also normal.  No evidence of UTI or yeast infection or bacterial vaginal infection.  I will send the urine sample to the lab to look further for bacteria but does not appear to be consistent with a UTI at this time.  We will call you if your result is positive and send antibiotics for you.  Increase your fluid intake at this time.  The burning could be related to atrophic vaginitis.  Make a follow-up appointment with your PCP or OB/GYN.  There are different creams that can be prescribed to help with this condition.

## 2022-05-05 LAB — URINE CULTURE: Culture: NO GROWTH

## 2022-05-06 ENCOUNTER — Encounter: Payer: Self-pay | Admitting: Emergency Medicine

## 2022-05-06 ENCOUNTER — Ambulatory Visit
Admission: EM | Admit: 2022-05-06 | Discharge: 2022-05-06 | Disposition: A | Discharge status: Home / Self Care | Payer: Medicare (Managed Care) | Attending: Emergency Medicine | Admitting: Emergency Medicine

## 2022-05-06 ENCOUNTER — Other Ambulatory Visit: Payer: Self-pay

## 2022-05-06 DIAGNOSIS — J069 Acute upper respiratory infection, unspecified: Secondary | ICD-10-CM

## 2022-05-06 HISTORY — DX: Essential (primary) hypertension: I10

## 2022-05-06 MED ORDER — IPRATROPIUM BROMIDE 0.06 % NA SOLN: 2.0000 | Freq: Four times a day (QID) | NASAL | 12 refills | Status: AC

## 2022-05-06 MED ORDER — BENZONATATE 100 MG PO CAPS: 200.0000 mg | ORAL_CAPSULE | Freq: Three times a day (TID) | ORAL | 0 refills | Status: DC

## 2022-05-06 MED ORDER — PROMETHAZINE-DM 6.25-15 MG/5ML PO SYRP: 5.0000 mL | ORAL_SOLUTION | Freq: Four times a day (QID) | ORAL | 0 refills | Status: DC | PRN

## 2022-05-06 NOTE — ED Triage Notes (Signed)
Onset 2 weeks ago, intermittent symptoms.  Seen 05/03/2022 for symptoms.  Reports throat has not improved .  Patient is concerned congestion may go to chest.  Reports cough keeps her awake at night.  Took robitussin maximum with little help

## 2022-05-06 NOTE — ED Provider Notes (Signed)
[facility]    CSN: [id number] Arrival date & time: 05/06/22  1335      History   Chief Complaint Chief Complaint  Patient presents with   Cough    HPI Leah Henry is a 71 y.o. female.   HPI  71 year old female here for reevaluation of respiratory complaints.  Patient was evaluated 3 days ago in this urgent care for sore throat and nasal congestion.  She continues to have a sore throat and now she has developed a nonproductive cough and some hoarseness.  She states that she is still experiencing nasal congestion with a white nasal discharge.  She denies fever, shortness of breath, or wheezing.  Past Medical History:  Diagnosis Date   Cancer ([place])    Hypertension     There are no problems to display for this patient.   Past Surgical History:  Procedure Laterality Date   CESAREAN SECTION     GALLBLADDER SURGERY     TONSILECTOMY, ADENOIDECTOMY, BILATERAL MYRINGOTOMY AND TUBES      OB History   No obstetric history on file.      Home Medications    Prior to Admission medications   Medication Sig Start Date End Date Taking? Authorizing Provider  benzonatate (TESSALON) 100 MG capsule Take 2 capsules (200 mg total) by mouth every 8 (eight) hours. 05/06/22  Yes Margarette Canada, NP  ipratropium (ATROVENT) 0.06 % nasal spray Place 2 sprays into both nostrils 4 (four) times daily. 05/06/22  Yes Margarette Canada, NP  promethazine-dextromethorphan (PROMETHAZINE-DM) 6.25-15 MG/5ML syrup Take 5 mLs by mouth 4 (four) times daily as needed. 05/06/22  Yes Margarette Canada, NP  carbamazepine (TEGRETOL) 200 MG tablet Take 1 tablet by mouth 3 (three) times daily. 12/15/09   [provider]  lisinopril (ZESTRIL) 40 MG tablet Take by mouth.    [provider]  metoprolol tartrate (LOPRESSOR) 25 MG tablet TAKE 1 TABLET BY MOUTH WITH FOOD TWICE A DAY FOR 30 DAYS 10/29/16   [provider]    Family History History reviewed. No pertinent family  history.  Social History Social History   Tobacco Use   Smoking status: Never   Smokeless tobacco: Never  Vaping Use   Vaping Use: Never used  Substance Use Topics   Alcohol use: Never   Drug use: Never     Allergies   Aspirin and Sulfa antibiotics   Review of Systems Review of Systems  Constitutional:  Negative for fever.  HENT:  Positive for congestion, rhinorrhea and sore throat.   Respiratory:  Positive for cough. Negative for shortness of breath and wheezing.      Physical Exam Triage Vital Signs ED Triage Vitals  Enc Vitals Group     BP 05/06/22 1357 135/70     Pulse Rate 05/06/22 1357 78     Resp 05/06/22 1357 20     Temp 05/06/22 1357 98.2 F (36.8 C)     Temp Source 05/06/22 1357 Oral     SpO2 05/06/22 1357 98 %     Weight --      Height --      Head Circumference --      Peak Flow --      Pain Score 05/06/22 1353 0     Pain Loc --      Pain Edu? --      Excl. in [place]? --    No data found.  Updated Vital Signs BP 135/70 (BP Location: Left Arm)  Pulse 78   Temp 98.2 F (36.8 C) (Oral)   Resp 20   SpO2 98%   Visual Acuity Right Eye Distance:   Left Eye Distance:   Bilateral Distance:    Right Eye Near:   Left Eye Near:    Bilateral Near:     Physical Exam Vitals and nursing note reviewed.  Constitutional:      Appearance: Normal appearance. She is not ill-appearing.  HENT:     Head: Normocephalic and atraumatic.     Right Ear: Tympanic membrane, ear canal and external ear normal. There is no impacted cerumen.     Left Ear: Tympanic membrane, ear canal and external ear normal. There is no impacted cerumen.     Nose: Congestion and rhinorrhea present.     Mouth/Throat:     Mouth: Mucous membranes are moist.     Pharynx: Oropharynx is clear. Posterior oropharyngeal erythema present. No oropharyngeal exudate.  Cardiovascular:     Rate and Rhythm: Normal rate and regular rhythm.     Pulses: Normal pulses.     Heart sounds: Normal  heart sounds. No murmur heard.    No friction rub. No gallop.  Pulmonary:     Effort: Pulmonary effort is normal.     Breath sounds: Normal breath sounds. No wheezing, rhonchi or rales.  Musculoskeletal:     Cervical back: Normal range of motion and neck supple.  Lymphadenopathy:     Cervical: No cervical adenopathy.  Skin:    General: Skin is warm and dry.     Capillary Refill: Capillary refill takes less than 2 seconds.     Findings: No erythema or rash.  Neurological:     General: No focal deficit present.     Mental Status: She is alert and oriented to person, place, and time.  Psychiatric:        Mood and Affect: Mood normal.        Behavior: Behavior normal.        Thought Content: Thought content normal.        Judgment: Judgment normal.      UC Treatments / Results  Labs (all labs ordered are listed, but only abnormal results are displayed) Labs Reviewed - No data to display  EKG   Radiology No results found.  Procedures Procedures (including critical care time)  Medications Ordered in UC Medications - No data to display  Initial Impression / Assessment and Plan / UC Course  I have reviewed the triage vital signs and the nursing notes.  Pertinent labs & imaging results that were available during my care of the patient were reviewed by me and considered in my medical decision making (see chart for details).  Patient is a pleasant, nontoxic-appearing 71 year old female here for evaluation of respiratory complaints as outlined HPI above.  Her physical exam reveals pearly-gray tympanic membranes bilaterally with normal light reflex and clear external auditory canals.  Nasal mucosa is erythematous and edematous with white discharge in both nares.  Oropharyngeal exam reveals posterior oropharyngeal erythema with clear to white postnasal drip.  Patient does not have any anterior cervical lymphadenopathy on exam.  Cardiopulmonary exam reveals S1-S2 heart sounds with  regular rate and rhythm and lung sounds that are clear to auscultation all fields.  Patient's exam is consistent with a viral upper respiratory infection.  I will treat her with Atrovent nasal spray, Tessalon Perles, and Promethazine DM cough syrup.  Return precautions reviewed   Final Clinical Impressions(s) / UC  Diagnoses   Final diagnoses:  Viral URI with cough     Discharge Instructions      Use the Atrovent nasal spray, 2 squirts in each nostril every 6 hours, as needed for runny nose and postnasal drip.  Use the Tessalon Perles every 8 hours during the day.  Take them with a small sip of water.  They may give you some numbness to the base of your tongue or a metallic taste in your mouth, this is normal.  Use the Promethazine DM cough syrup at bedtime for cough and congestion.  It will make you drowsy so do not take it during the day.  Return for reevaluation or see your primary care provider for any new or worsening symptoms.      ED Prescriptions     Medication Sig Dispense Auth. Provider   benzonatate (TESSALON) 100 MG capsule Take 2 capsules (200 mg total) by mouth every 8 (eight) hours. 21 capsule Margarette Canada, NP   ipratropium (ATROVENT) 0.06 % nasal spray Place 2 sprays into both nostrils 4 (four) times daily. 15 mL Margarette Canada, NP   promethazine-dextromethorphan (PROMETHAZINE-DM) 6.25-15 MG/5ML syrup Take 5 mLs by mouth 4 (four) times daily as needed. 118 mL Margarette Canada, NP      PDMP not reviewed this encounter.   Margarette Canada, NP 05/06/22 1426

## 2022-05-06 NOTE — Discharge Instructions (Signed)

## 2022-05-08 ENCOUNTER — Ambulatory Visit
Admission: EM | Admit: 2022-05-08 | Discharge: 2022-05-08 | Disposition: A | Discharge status: Home / Self Care | Payer: Medicare (Managed Care) | Attending: Internal Medicine | Admitting: Internal Medicine

## 2022-05-08 ENCOUNTER — Ambulatory Visit (INDEPENDENT_AMBULATORY_CARE_PROVIDER_SITE_OTHER): Payer: Medicare (Managed Care)

## 2022-05-08 DIAGNOSIS — J45909 Unspecified asthma, uncomplicated: Secondary | ICD-10-CM | POA: Diagnosis not present

## 2022-05-08 DIAGNOSIS — R059 Cough, unspecified: Secondary | ICD-10-CM | POA: Diagnosis not present

## 2022-05-08 DIAGNOSIS — R0989 Other specified symptoms and signs involving the circulatory and respiratory systems: Secondary | ICD-10-CM

## 2022-05-08 MED ORDER — METHYLPREDNISOLONE 4 MG PO TBPK: ORAL_TABLET | ORAL | 0 refills | Status: DC

## 2022-05-08 MED ORDER — ALBUTEROL SULFATE HFA 108 (90 BASE) MCG/ACT IN AERS: 2.0000 | INHALATION_SPRAY | RESPIRATORY_TRACT | 0 refills | Status: AC | PRN

## 2022-05-08 MED ORDER — ALBUTEROL SULFATE (2.5 MG/3ML) 0.083% IN NEBU
2.5000 mg | INHALATION_SOLUTION | Freq: Once | RESPIRATORY_TRACT | Status: AC
  Administered 2022-05-08: 2.5 mg via RESPIRATORY_TRACT

## 2022-05-08 MED ORDER — HYDROCOD POLI-CHLORPHE POLI ER 10-8 MG/5ML PO SUER: 5.0000 mL | Freq: Every evening | ORAL | 0 refills | Status: DC | PRN

## 2022-05-08 NOTE — ED Provider Notes (Signed)
[facility]    CSN: [id number] Arrival date & time: 05/08/22  1317      History   Chief Complaint Chief Complaint  Patient presents with   Cough    HPI Leah Henry is a 71 y.o. female who presents with worse cough and now has been wheezing and her pulse ox is getting low. Denies a fever. The cough is keeping her up at night time and the cough rx given to her a few days ago is not helping. Her cough is not productive. States this started with itching throat and ST with rhinitis and cough.     Past Medical History:  Diagnosis Date   Cancer ([place])    Hypertension     There are no problems to display for this patient.   Past Surgical History:  Procedure Laterality Date   CESAREAN SECTION     GALLBLADDER SURGERY     TONSILECTOMY, ADENOIDECTOMY, BILATERAL MYRINGOTOMY AND TUBES      OB History   No obstetric history on file.      Home Medications    Prior to Admission medications   Medication Sig Start Date End Date Taking? Authorizing Provider  benzonatate (TESSALON) 100 MG capsule Take 2 capsules (200 mg total) by mouth every 8 (eight) hours. 05/06/22  Yes Margarette Canada, NP  carbamazepine (TEGRETOL) 200 MG tablet Take 1 tablet by mouth 3 (three) times daily. 12/15/09  Yes [provider]  chlorpheniramine-HYDROcodone (TUSSIONEX PENNKINETIC ER) 10-8 MG/5ML Take 5 mLs by mouth at bedtime as needed for cough. 05/08/22  Yes Rodriguez-Southworth, Sunday Spillers, PA-C  ipratropium (ATROVENT) 0.06 % nasal spray Place 2 sprays into both nostrils 4 (four) times daily. 05/06/22  Yes Margarette Canada, NP  lisinopril (ZESTRIL) 40 MG tablet Take by mouth.   Yes [provider]  methylPREDNISolone (MEDROL DOSEPAK) 4 MG TBPK tablet Take as directed 05/08/22  Yes Rodriguez-Southworth, Sunday Spillers, PA-C  metoprolol tartrate (LOPRESSOR) 25 MG tablet TAKE 1 TABLET BY MOUTH WITH FOOD TWICE A DAY FOR 30 DAYS 10/29/16  Yes [provider]    Family History History  reviewed. No pertinent family history.  Social History Social History   Tobacco Use   Smoking status: Never   Smokeless tobacco: Never  Vaping Use   Vaping Use: Never used  Substance Use Topics   Alcohol use: Never   Drug use: Never     Allergies   Aspirin and Sulfa antibiotics   Review of Systems Review of Systems  Constitutional:  Negative for fatigue and fever.  HENT:  Positive for postnasal drip. Negative for ear discharge and ear pain.   Respiratory:  Positive for cough and wheezing. Negative for chest tightness and shortness of breath.   Cardiovascular:  Negative for chest pain.  Musculoskeletal:  Negative for myalgias.  Neurological:  Negative for headaches.     Physical Exam Triage Vital Signs ED Triage Vitals  Enc Vitals Group     BP 05/08/22 1354 (!) 141/73     Pulse Rate 05/08/22 1354 71     Resp 05/08/22 1354 18     Temp 05/08/22 1354 97.8 F (36.6 C)     Temp Source 05/08/22 1354 Oral     SpO2 05/08/22 1354 95 %     Weight 05/08/22 1352 201 lb (91.2 kg)     Height 05/08/22 1352 '5\' 5"'$  (1.651 m)     Head Circumference --      Peak Flow --      Pain  Score 05/08/22 1351 0     Pain Loc --      Pain Edu? --      Excl. in [place]? --    No data found.  Updated Vital Signs BP (!) 141/73 (BP Location: Left Arm)   Pulse 71   Temp 97.8 F (36.6 C) (Oral)   Resp 18   Ht '5\' 5"'$  (1.651 m)   Wt 201 lb (91.2 kg)   SpO2 95%   BMI 33.45 kg/m   Visual Acuity Right Eye Distance:   Left Eye Distance:   Bilateral Distance:    Right Eye Near:   Left Eye Near:    Bilateral Near:      Physical Exam Constitutional:      General: He is not in acute distress.    Appearance: He is not toxic-appearing.  HENT:     Head: Normocephalic.     Right Ear: Tympanic membrane, ear canal and external ear normal.     Left Ear: Ear canal and external ear normal.     Nose: Nose normal.     Mouth/Throat:     Mouth: Mucous membranes are moist.     Pharynx: Oropharynx is  clear.  Eyes:     General: No scleral icterus.    Conjunctiva/sclera: Conjunctivae normal.  Cardiovascular:     Rate and Rhythm: Normal rate and regular rhythm.     Heart sounds: No murmur heard.   Pulmonary:     Effort: Pulmonary effort is normal. No respiratory distress.     Breath sounds: Wheezing present.     Comments: Has auditory wheezing Musculoskeletal:        General: Normal range of motion.     Cervical back: Neck supple.  Lymphadenopathy:     Cervical: No cervical adenopathy.  Skin:    General: Skin is warm and dry.     Findings: No rash.  Neurological:     Mental Status: He is alert and oriented to person, place, and time.     Gait: Gait normal.  Psychiatric:        Mood and Affect: Mood normal.        Behavior: Behavior normal.        Thought Content: Thought content normal.        Judgment: Judgment normal.    UC Treatments / Results  Labs (all labs ordered are listed, but only abnormal results are displayed) Labs Reviewed - No data to display  EKG   Radiology DG Chest 2 View  Result Date: 05/08/2022 CLINICAL DATA:  Cough and decreased oxygen saturation. EXAM: CHEST - 2 VIEW COMPARISON:  09/13/2021. FINDINGS: Normal heart, mediastinum and hila. Clear lungs.  No pleural effusion or pneumothorax. Skeletal structures are intact. IMPRESSION: No active cardiopulmonary disease. Electronically Signed   By: Lajean Manes M.D.   On: 05/08/2022 14:33    Procedures Procedures (including critical care time)  Medications Ordered in UC Medications  albuterol (PROVENTIL) (2.5 MG/3ML) 0.083% nebulizer solution 2.5 mg (has no administration in time range)    Initial Impression / Assessment and Plan / UC Course  I have reviewed the triage vital signs and the nursing notes.  Pertinent  imaging results that were available during my care of the patient were reviewed by me and considered in my medical decision making (see chart for details).  She was given Albuterol  Neb and this helped her wheezing  I placed her on Tussionex, Medrol and Albuterol inhaler See instructions  Final Clinical Impressions(s) / UC Diagnoses   Final diagnoses:  Bronchitis, allergic, unspecified asthma severity, uncomplicated     Discharge Instructions      You may continue the cough pills for day time, but stop the one for night time and I am giving you a different one.   Use the albuterol inhaler every 4 hours for 3 days, then after that only as needed for cough and wheezing     ED Prescriptions     Medication Sig Dispense Auth. Provider   methylPREDNISolone (MEDROL DOSEPAK) 4 MG TBPK tablet Take as directed 21 tablet Rodriguez-Southworth, Sunday Spillers, PA-C   chlorpheniramine-HYDROcodone (TUSSIONEX PENNKINETIC ER) 10-8 MG/5ML Take 5 mLs by mouth at bedtime as needed for cough. 115 mL Rodriguez-Southworth, Sunday Spillers, PA-C      I have reviewed the PDMP during this encounter.   Shelby Mattocks, [place] 05/08/22 1522

## 2022-05-08 NOTE — Discharge Instructions (Addendum)
You may continue the cough pills for day time, but stop the one for night time and I am giving you a different one.   Use the albuterol inhaler every 4 hours for 3 days, then after that only as needed for cough and wheezing

## 2022-05-08 NOTE — ED Triage Notes (Signed)
Pt c/o worsening cough since her last visit. Pt sees her PCP on Wednesday.   Pt was give in ipratropium bromide and she states that it causes nausea and vomiting the last 2 times she used them.   Pt states that her oxygen at home is around 96.

## 2022-05-25 ENCOUNTER — Telehealth: Payer: Self-pay | Admitting: Family Medicine

## 2022-05-25 ENCOUNTER — Ambulatory Visit
Admission: EM | Admit: 2022-05-25 | Discharge: 2022-05-25 | Disposition: A | Discharge status: Home / Self Care | Payer: Medicare (Managed Care) | Attending: Family Medicine | Admitting: Family Medicine

## 2022-05-25 ENCOUNTER — Encounter: Payer: Self-pay | Admitting: Emergency Medicine

## 2022-05-25 DIAGNOSIS — R42 Dizziness and giddiness: Secondary | ICD-10-CM | POA: Diagnosis present

## 2022-05-25 DIAGNOSIS — R5383 Other fatigue: Secondary | ICD-10-CM | POA: Diagnosis present

## 2022-05-25 LAB — COMPREHENSIVE METABOLIC PANEL
ALT: 14 U/L (ref 0–44)
AST: 15 U/L (ref 15–41)
Albumin: 4.1 g/dL (ref 3.5–5.0)
Alkaline Phosphatase: 130 U/L — ABNORMAL HIGH (ref 38–126)
Anion gap: 6 (ref 5–15)
BUN: 13 mg/dL (ref 8–23)
CO2: 27 mmol/L (ref 22–32)
Calcium: 8.5 mg/dL — ABNORMAL LOW (ref 8.9–10.3)
Chloride: 94 mmol/L — ABNORMAL LOW (ref 98–111)
Creatinine, Ser: 0.64 mg/dL (ref 0.44–1.00)
GFR, Estimated: >60 mL/min (ref >60)
Glucose, Bld: 117 mg/dL — ABNORMAL HIGH (ref 70–99)
Potassium: 4.6 mmol/L (ref 3.5–5.1)
Sodium: 127 mmol/L — ABNORMAL LOW (ref 135–145)
Total Bilirubin: 0.2 mg/dL — ABNORMAL LOW (ref 0.3–1.2)
Total Protein: 7.6 g/dL (ref 6.5–8.1)

## 2022-05-25 LAB — CBC WITH DIFFERENTIAL/PLATELET
Abs Immature Granulocytes: 0.01 10*3/uL (ref 0.00–0.07)
Basophils Absolute: 0 10*3/uL (ref 0.0–0.1)
Basophils Relative: 0 %
Eosinophils Absolute: 0.1 10*3/uL (ref 0.0–0.5)
Eosinophils Relative: 2 %
HCT: 31.8 % — ABNORMAL LOW (ref 36.0–46.0)
Hemoglobin: 10.9 g/dL — ABNORMAL LOW (ref 12.0–15.0)
Immature Granulocytes: 0 %
Lymphocytes Relative: 29 %
Lymphs Abs: 1.5 10*3/uL (ref 0.7–4.0)
MCH: 31.7 pg (ref 26.0–34.0)
MCHC: 34.3 g/dL (ref 30.0–36.0)
MCV: 92.4 fL (ref 80.0–100.0)
Monocytes Absolute: 0.6 10*3/uL (ref 0.1–1.0)
Monocytes Relative: 12 %
Neutro Abs: 3 10*3/uL (ref 1.7–7.7)
Neutrophils Relative %: 57 %
Platelets: 173 10*3/uL (ref 150–400)
RBC: 3.44 MIL/uL — ABNORMAL LOW (ref 3.87–5.11)
RDW: 13.2 % (ref 11.5–15.5)
WBC: 5.3 10*3/uL (ref 4.0–10.5)
nRBC: 0 % (ref 0.0–0.2)

## 2022-05-25 LAB — TSH: TSH: 0.414 u[IU]/mL (ref 0.350–4.500)

## 2023-01-18 IMAGING — CR DG CHEST 2V
2 series · 2 of 2 positions shown · non-contrast
Comparison: 09/13/2021.

CLINICAL DATA: Cough and decreased oxygen saturation.

EXAM:
CHEST - 2 VIEW

[chest pa]
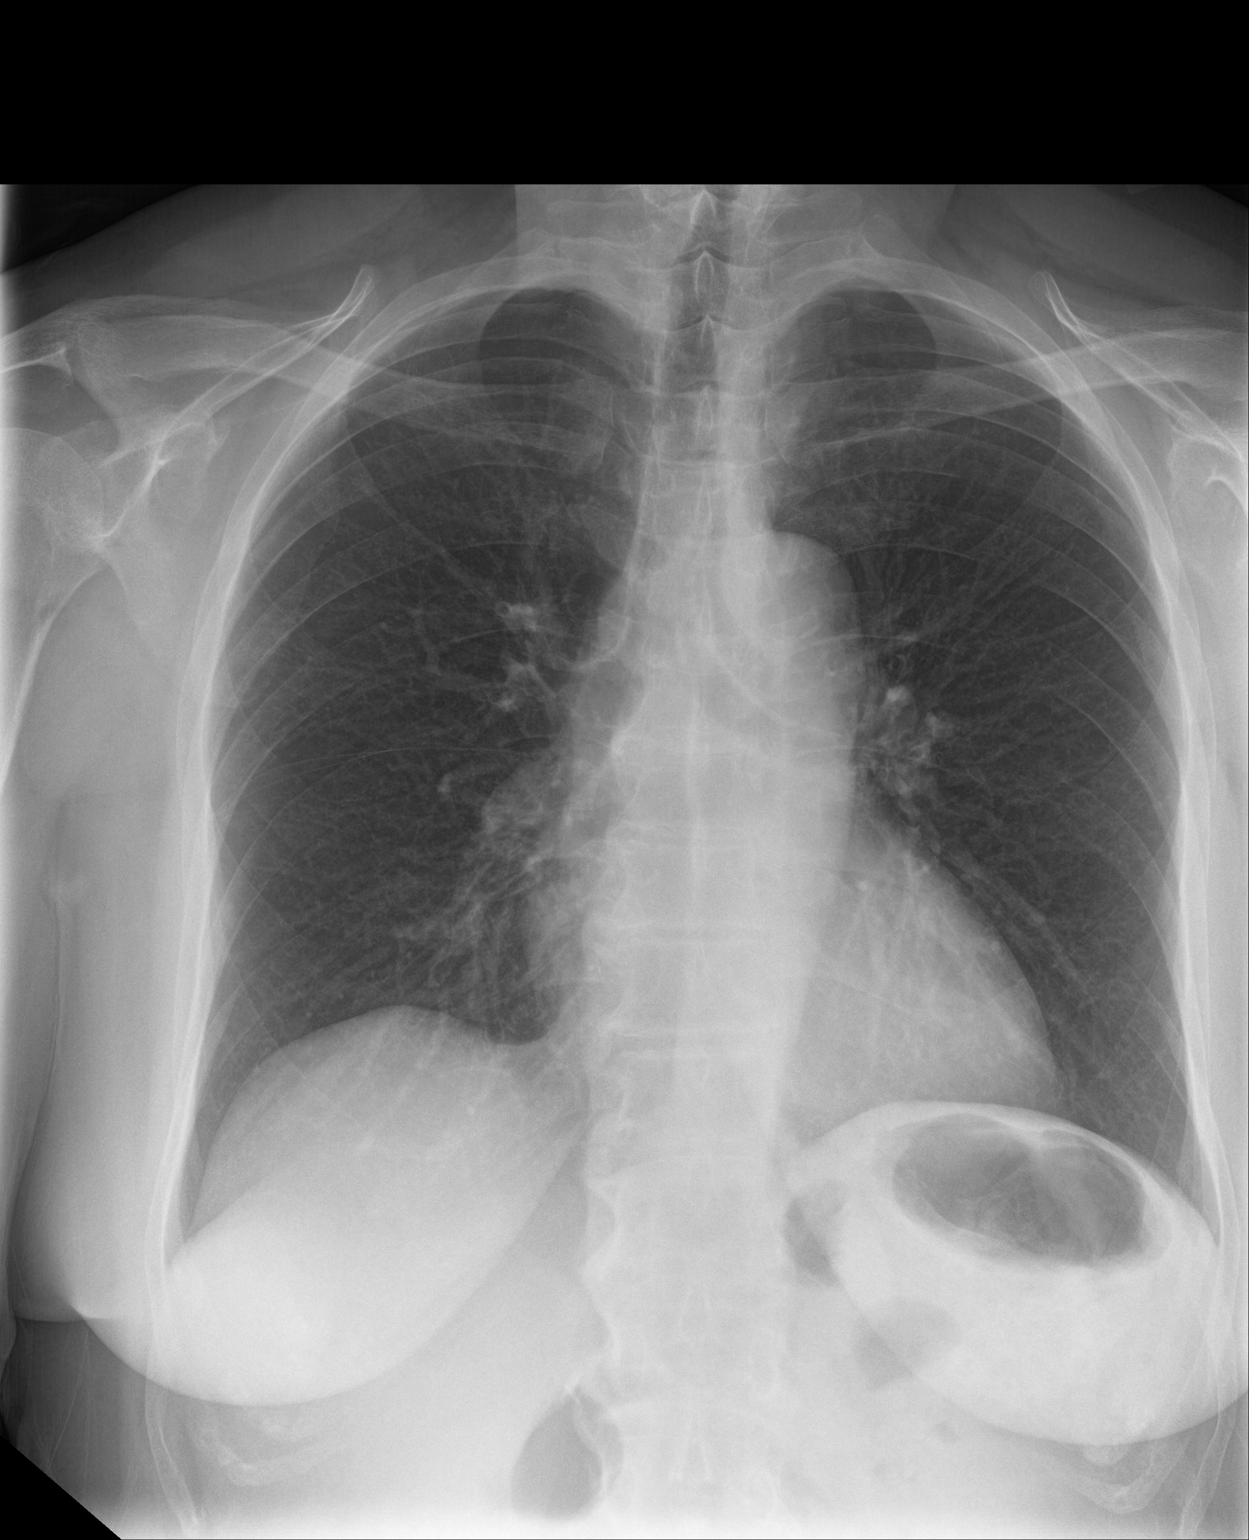

[chest lat]
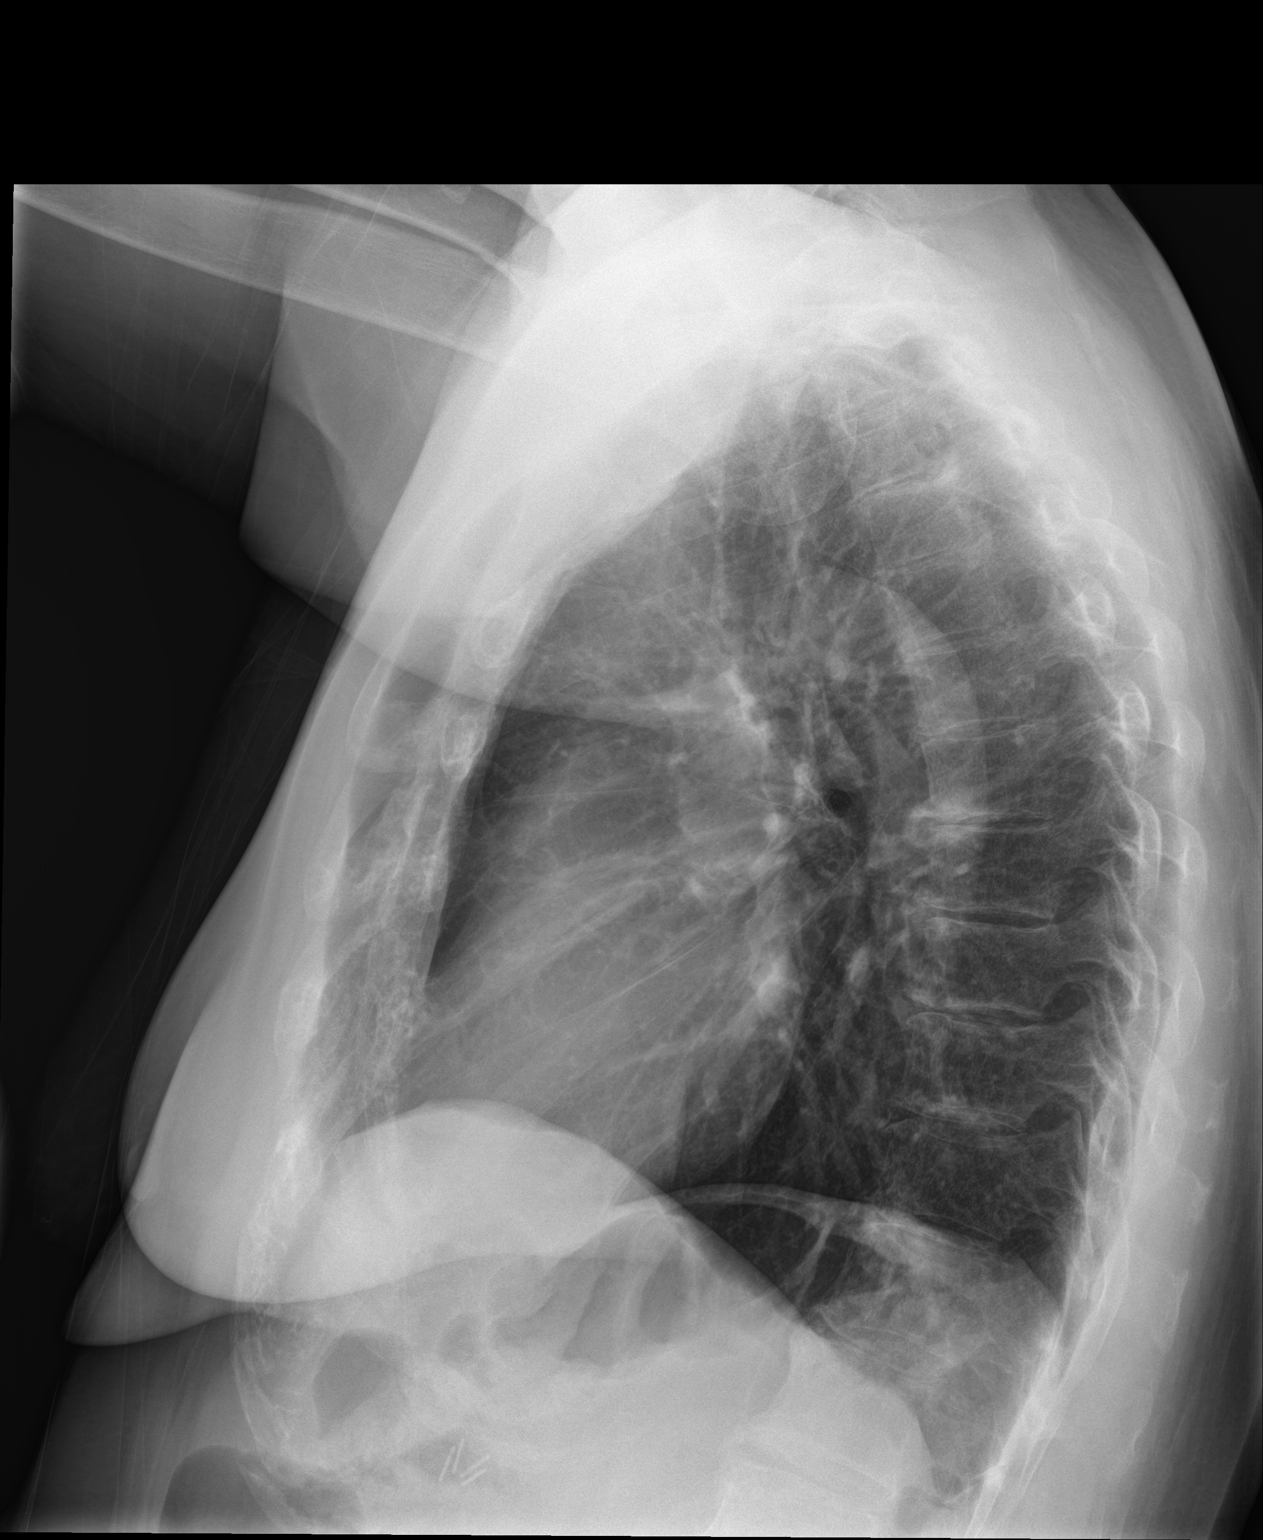

[2 of 2 positions shown; findings below may reference images not displayed]

FINDINGS: Normal heart, mediastinum and hila.

Clear lungs.  No pleural effusion or pneumothorax.

Skeletal structures are intact.
IMPRESSION: No active cardiopulmonary disease.
